# Patient Record
Sex: Female | Born: 1935 | Hispanic: No | Smoking: Current some day smoker
Health system: Southern US, Community
[De-identification: ages and names within clinical notes are randomized; demographics above are authoritative.]

## PROBLEM LIST (undated history)

## (undated) DIAGNOSIS — J449 Chronic obstructive pulmonary disease, unspecified: Secondary | ICD-10-CM

## (undated) HISTORY — PX: HIP FRACTURE SURGERY: SHX118

---

## 2003-11-28 ENCOUNTER — Ambulatory Visit: Payer: Self-pay | Admitting: Rheumatology

## 2004-04-30 ENCOUNTER — Ambulatory Visit: Payer: Self-pay | Admitting: Urology

## 2004-04-30 ENCOUNTER — Ambulatory Visit: Payer: Self-pay | Admitting: Internal Medicine

## 2004-10-26 ENCOUNTER — Ambulatory Visit: Payer: Self-pay | Admitting: Urology

## 2004-11-04 ENCOUNTER — Ambulatory Visit: Payer: Self-pay | Admitting: Urology

## 2005-02-10 ENCOUNTER — Ambulatory Visit: Payer: Self-pay | Admitting: Unknown Physician Specialty

## 2005-06-30 ENCOUNTER — Ambulatory Visit: Payer: Self-pay | Admitting: Internal Medicine

## 2005-08-10 ENCOUNTER — Ambulatory Visit: Payer: Self-pay | Admitting: Gastroenterology

## 2005-09-20 ENCOUNTER — Ambulatory Visit: Payer: Self-pay | Admitting: Gastroenterology

## 2005-09-23 ENCOUNTER — Observation Stay: Payer: Self-pay | Admitting: Gastroenterology

## 2005-09-27 ENCOUNTER — Ambulatory Visit: Payer: Self-pay | Admitting: Gastroenterology

## 2005-11-08 ENCOUNTER — Ambulatory Visit: Payer: Self-pay | Admitting: Surgery

## 2005-11-15 ENCOUNTER — Other Ambulatory Visit: Payer: Self-pay

## 2005-11-15 ENCOUNTER — Inpatient Hospital Stay: Payer: Self-pay | Admitting: Surgery

## 2008-12-19 ENCOUNTER — Ambulatory Visit: Payer: Self-pay | Admitting: Internal Medicine

## 2009-04-30 ENCOUNTER — Inpatient Hospital Stay: Payer: Self-pay | Admitting: General Practice

## 2010-01-22 ENCOUNTER — Ambulatory Visit: Payer: Self-pay | Admitting: Internal Medicine

## 2010-07-06 ENCOUNTER — Ambulatory Visit: Payer: Self-pay | Admitting: Gastroenterology

## 2010-07-08 LAB — PATHOLOGY REPORT

## 2011-01-27 ENCOUNTER — Ambulatory Visit: Payer: Self-pay | Admitting: Internal Medicine

## 2011-06-25 ENCOUNTER — Inpatient Hospital Stay: Payer: Self-pay | Admitting: *Deleted

## 2011-06-25 LAB — CBC
HCT: 45.2 % (ref 35.0–47.0)
MCH: 35.4 pg — ABNORMAL HIGH (ref 26.0–34.0)
MCHC: 34.2 g/dL (ref 32.0–36.0)
Platelet: 197 10*3/uL (ref 150–440)
RBC: 4.37 10*6/uL (ref 3.80–5.20)
RDW: 14 % (ref 11.5–14.5)

## 2011-06-25 LAB — COMPREHENSIVE METABOLIC PANEL
Albumin: 3.1 g/dL — ABNORMAL LOW (ref 3.4–5.0)
Anion Gap: 9 (ref 7–16)
BUN: 18 mg/dL (ref 7–18)
Calcium, Total: 8.7 mg/dL (ref 8.5–10.1)
Co2: 29 mmol/L (ref 21–32)
EGFR (African American): >60
EGFR (Non-African Amer.): >60
Glucose: 95 mg/dL (ref 65–99)
Osmolality: 276 (ref 275–301)
SGPT (ALT): 36 U/L
Sodium: 137 mmol/L (ref 136–145)
Total Protein: 7.2 g/dL (ref 6.4–8.2)

## 2011-06-25 LAB — MAGNESIUM: Magnesium: 2 mg/dL

## 2011-06-25 LAB — TROPONIN I: Troponin-I: <0.02 ng/mL

## 2011-06-25 LAB — CK TOTAL AND CKMB (NOT AT ARMC): CK, Total: 320 U/L — ABNORMAL HIGH (ref 21–215)

## 2011-06-26 LAB — CBC WITH DIFFERENTIAL/PLATELET
Basophil #: 0 10*3/uL (ref 0.0–0.1)
Basophil %: 0.3 %
HCT: 39.1 % (ref 35.0–47.0)
HGB: 13.5 g/dL (ref 12.0–16.0)
Lymphocyte #: 0.5 10*3/uL — ABNORMAL LOW (ref 1.0–3.6)
MCH: 35.8 pg — ABNORMAL HIGH (ref 26.0–34.0)
Monocyte %: 11.2 %
Neutrophil #: 5.2 10*3/uL (ref 1.4–6.5)
Platelet: 182 10*3/uL (ref 150–440)
RDW: 13.7 % (ref 11.5–14.5)
WBC: 6.4 10*3/uL (ref 3.6–11.0)

## 2011-06-26 LAB — BASIC METABOLIC PANEL WITH GFR
Anion Gap: 10
BUN: 20 mg/dL — ABNORMAL HIGH
Calcium, Total: 8.1 mg/dL — ABNORMAL LOW
Chloride: 102 mmol/L
Co2: 27 mmol/L
Creatinine: 0.62 mg/dL
EGFR (African American): >60
EGFR (Non-African Amer.): >60
Glucose: 139 mg/dL — ABNORMAL HIGH
Osmolality: 282
Potassium: 4.1 mmol/L
Sodium: 139 mmol/L

## 2011-06-26 LAB — TROPONIN I: Troponin-I: <0.02 ng/mL

## 2013-02-02 IMAGING — CT CT CHEST W/ CM
1 series · 15 of 33 positions shown, 19 images · IV contrast (agent unspecified)
Comparison: None

REASON FOR EXAM: sob hypoxia pe protocol
COMMENTS:   May transport without cardiac monitor

PROCEDURE:     CT  - CT CHEST WITH CONTRAST  - June 25, 2011  [DATE]
RESULT:     Indications: Shortness of breath
TECHNIQUE: A thin-section spiral CT from the lung apices to the upper
abdomen was acquired on a multi slice scanner following 85ml [id number]-3TF
intravenous contrast. These images were then transferred to the Siemens work
station and were subsequently reviewed utilizing 3-D reconstructions and MIP
images.

[Series 4: soft tissue · axial · 0.58mm/px · z∈[-1050,-764]mm · 15 of 113 slices shown, 19 images]
[im 9/113  mediastinal]
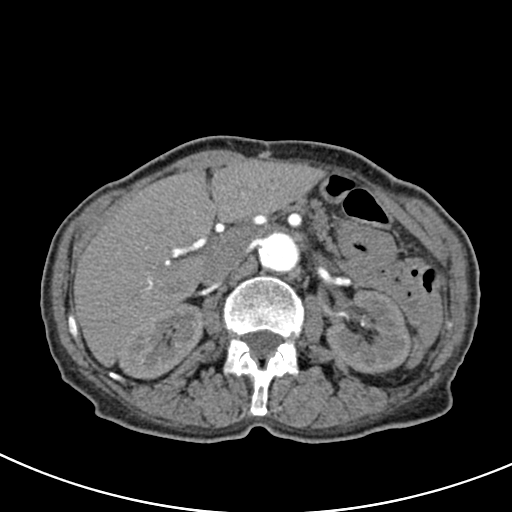
[im 9/113  lung]
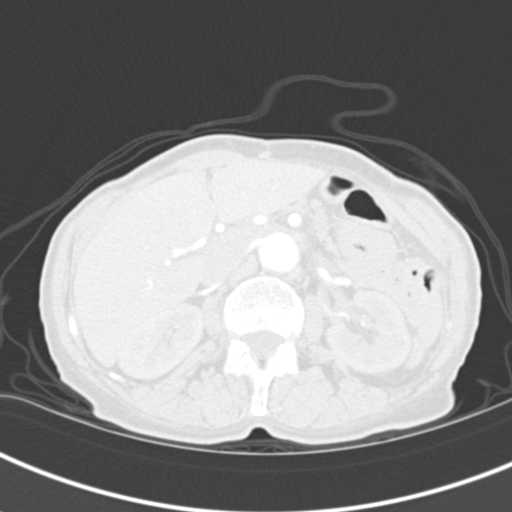
[im 17/113  lung]
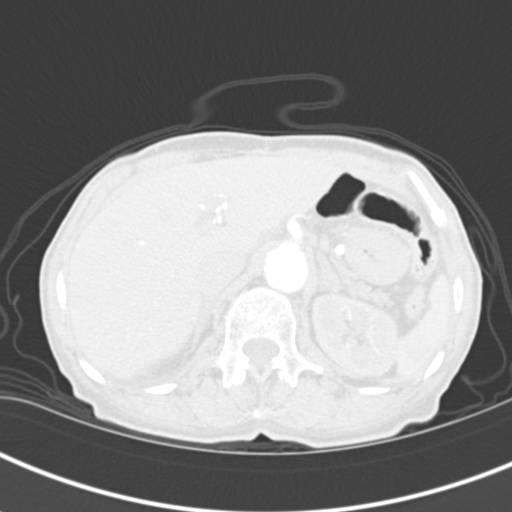
[im 23/113  lung]
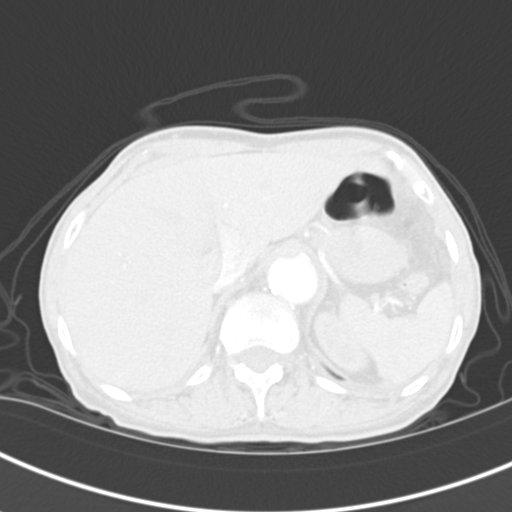
[im 30/113  lung]
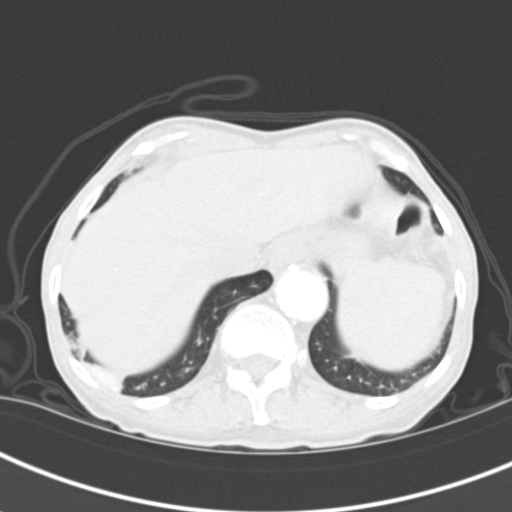
[im 38/113  mediastinal]
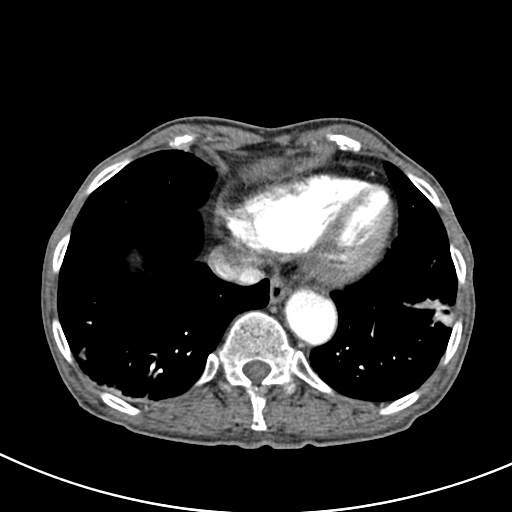
[im 38/113  lung]
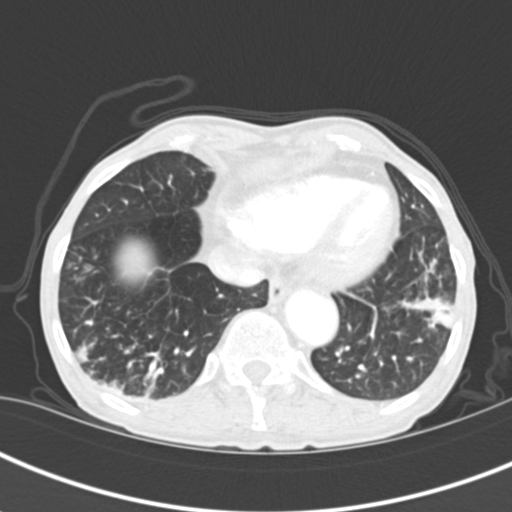
[im 45/113  lung]
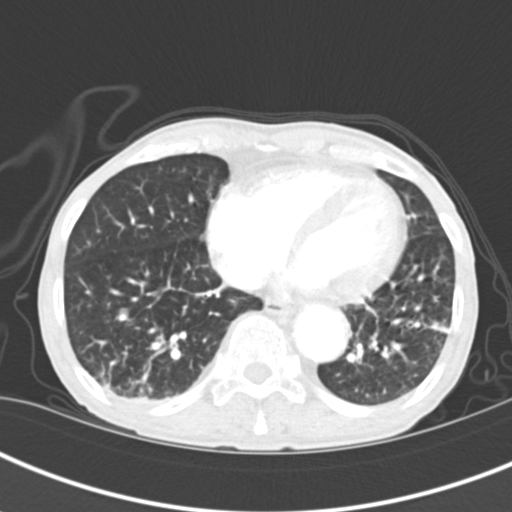
[im 50/113  lung]
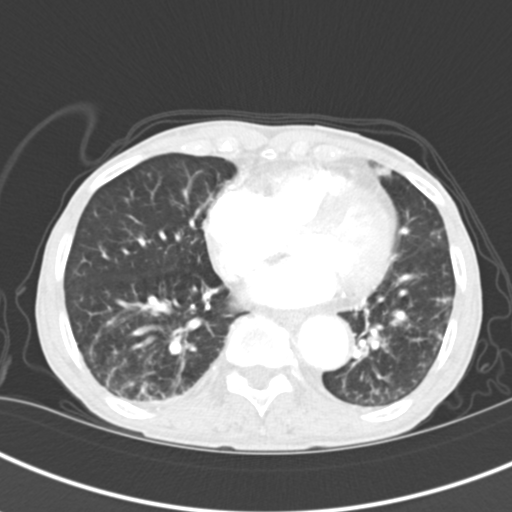
[im 59/113  lung]
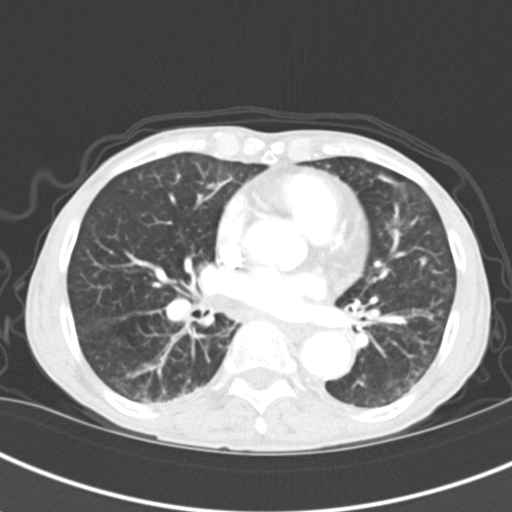
[im 63/113  mediastinal]
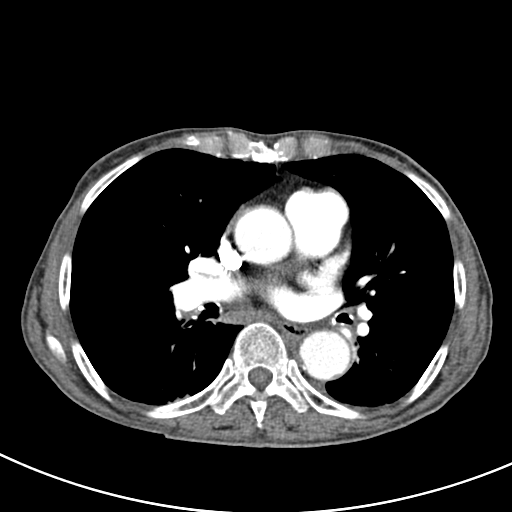
[im 63/113  lung]
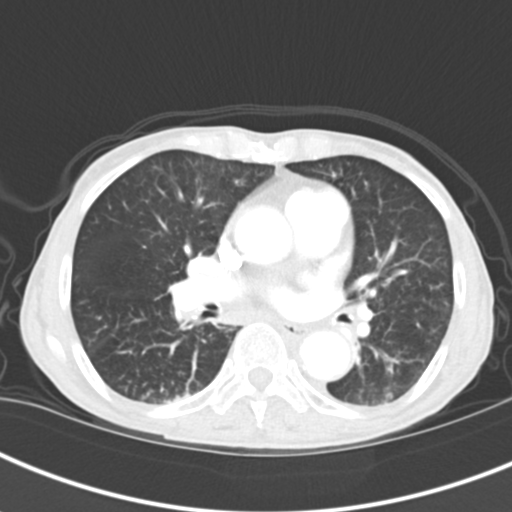
[im 68/113  lung]
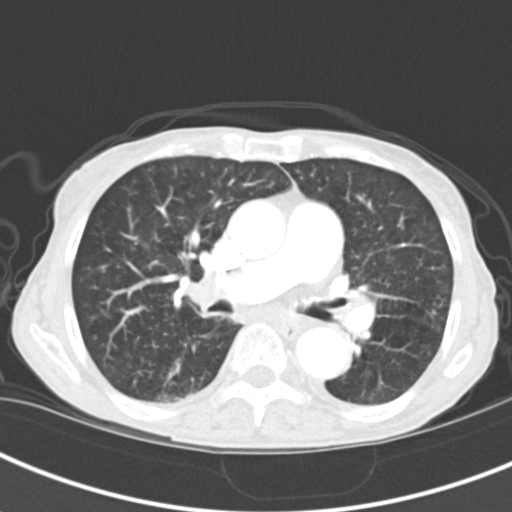
[im 75/113  lung]
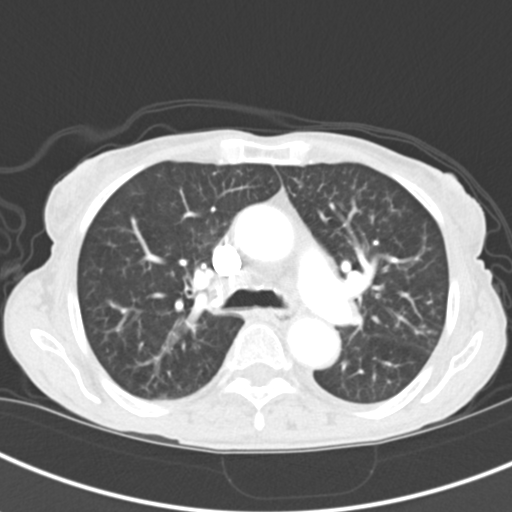
[im 83/113  lung]
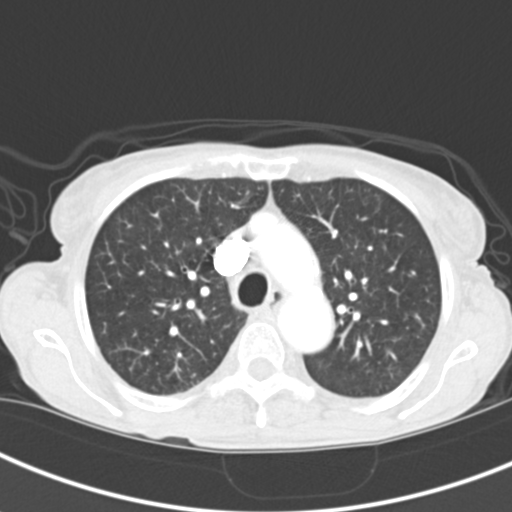
[im 90/113  mediastinal]
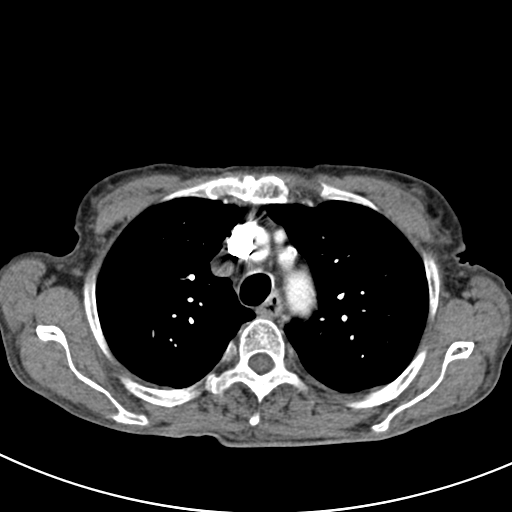
[im 90/113  lung]
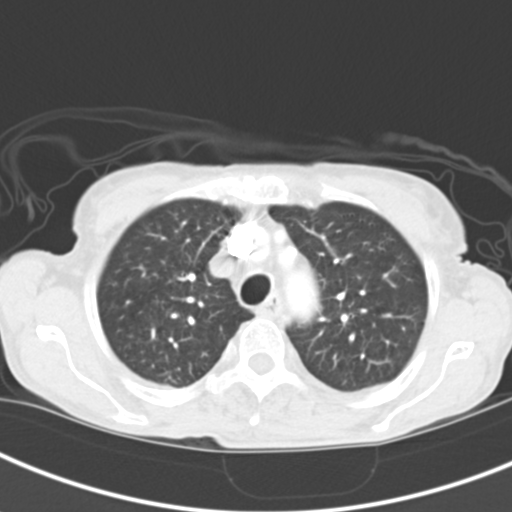
[im 96/113  lung]
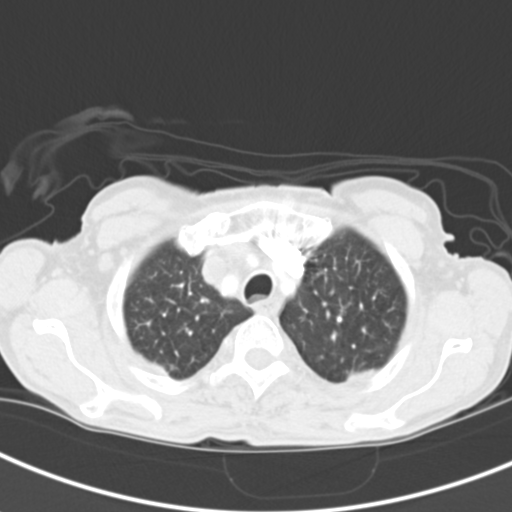
[im 104/113  lung]
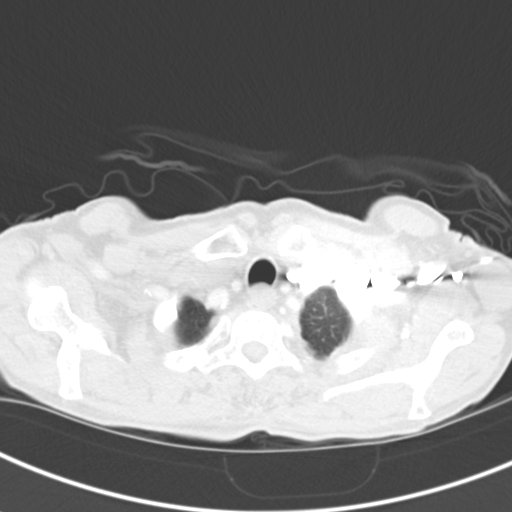

[15 of 33 positions shown; findings below may reference images not displayed]

FINDINGS: There is adequate opacification of the pulmonary arteries. There is no
pulmonary embolus. The main pulmonary artery, right main pulmonary artery,
and left main pulmonary arteries are normal in size. The heart size is
normal. There is no pericardial effusion. There is coronary artery
atherosclerosis involving the LAD.

There is mild reticular-nodular interstitial disease involving the lung
bases which may be secondary to an infectious or inflammatory etiology.
There is bibasilar atelectasis present. There is no pleural effusion or
pneumothorax.

There is no axillary, hilar, or mediastinal adenopathy.

The osseous structures are unremarkable.

The visualized portions of the upper abdomen are unremarkable.
IMPRESSION: 1. No CT evidence of pulmonary embolus.

2. Coronary artery disease most severe in the LAD.

3. Bilateral lower lobe reticular-nodular interstitial disease as can be
seen with an infectious or inflammatory etiology. Correlate with laboratory
values and clinical exam.

[REDACTED]

## 2013-02-19 ENCOUNTER — Ambulatory Visit: Payer: Self-pay | Admitting: Internal Medicine

## 2014-02-03 DIAGNOSIS — J449 Chronic obstructive pulmonary disease, unspecified: Secondary | ICD-10-CM | POA: Diagnosis not present

## 2014-02-05 DIAGNOSIS — J449 Chronic obstructive pulmonary disease, unspecified: Secondary | ICD-10-CM | POA: Diagnosis not present

## 2014-03-06 DIAGNOSIS — J449 Chronic obstructive pulmonary disease, unspecified: Secondary | ICD-10-CM | POA: Diagnosis not present

## 2014-03-08 DIAGNOSIS — J449 Chronic obstructive pulmonary disease, unspecified: Secondary | ICD-10-CM | POA: Diagnosis not present

## 2014-04-04 DIAGNOSIS — J449 Chronic obstructive pulmonary disease, unspecified: Secondary | ICD-10-CM | POA: Diagnosis not present

## 2014-04-06 DIAGNOSIS — J449 Chronic obstructive pulmonary disease, unspecified: Secondary | ICD-10-CM | POA: Diagnosis not present

## 2014-05-05 DIAGNOSIS — J449 Chronic obstructive pulmonary disease, unspecified: Secondary | ICD-10-CM | POA: Diagnosis not present

## 2014-05-05 NOTE — Discharge Summary (Signed)
PATIENT NAME:  Marcia Owens, Karli MR#:  [id number] DATE OF BIRTH:  18-Feb-1930  DATE OF ADMISSION:  06/25/2011 DATE OF DISCHARGE:  06/28/2011  HISTORY OF PRESENT ILLNESS: Marcia Owens is a 81 year old black lady with a long history of chronic obstructive pulmonary disease and continued tobacco abuse who presented to the Emergency Room with increasing shortness of breath, wheezing, and cough. In the Emergency Room, she was noted to have a pulse oximetry of 88% on room air and was therefore admitted.   The patient's past medical history, medications, allergies, and admission physical examination is well outlined in Dr. Urban GibsonGupta's dictated admission note.   LABORATORY, DIAGNOSTIC AND RADIOLOGIC DATA: Admission CBC showed a hemoglobin of 13.5 with a hematocrit of 39.1. Indices were somewhat macrocytic. White count was 6400. Platelet count was 182,000. Admission comprehensive metabolic panel was notable only for a potassium of 3.4 and an albumin of 3.1. Magnesium was 2.0.   Admission arterial blood gases showed a pH of 7.41, a pCO2 of 47, and a pO2 of 46.   Admission electrocardiogram showed a sinus bradycardia with PVCs. There was a right axis consistent with her chronic obstructive pulmonary disease.   Admission chest x-ray showed components of chronic obstructive pulmonary disease. There was evidence of pulmonary fibrosis. No active infiltrate was noted.   Chest CT showed bilateral lower lobe reticular nodular interstitial disease consistent with pulmonary fibrosis or an inflammatory etiology. Coronary artery disease was incidentally noted in the LAD, in the form of calcification.   HOSPITAL COURSE: The patient was admitted to the regular medical floor where she was started on supplemental oxygen. She was also started on an intensive pulmonary toilet of IV steroids, IV antibiotics, and SVNs. She was eventually bridged over to oral agents. She was noted to be hypoxic with ambulation at the time of discharge  and therefore was set up for home oxygen.   DISCHARGE DIAGNOSIS: Acute on chronic respiratory failure secondary to a chronic obstructive pulmonary disease flare.   DISCHARGE MEDICATIONS: The patient was continued on her preadmission inhalers including Advair, Spiriva, and Pro Air. Her other preadmission medications were also left unchanged. She was sent home on a Sterapred double strength six day taper. She was also to continue Levaquin 500 mg daily for an additional four days. She was discharged on home oxygen at 1 liter/minute.   DISCHARGE INSTRUCTIONS/FOLLOWUP: She was strongly encouraged to stop smoking. The patient is to be followed up in the office in 1 to 2 weeks.  ____________________________ Letta PateJohn B. Danne HarborWalker III, MD [id number] D: 07/12/2011 11:24:44 ET T: 07/12/2011 11:34:51 ET JOB#: [id number]  cc: Jonny RuizJohn B. Danne HarborWalker III, MD, <Dictator> Elmo PuttJOHN B WALKER III MD ELECTRONICALLY SIGNED 07/12/2011 15:30

## 2014-05-05 NOTE — H&P (Signed)
PATIENT NAME:  Marcia Owens, Marcia Owens MR#:  [id number] DATE OF BIRTH:  1930/10/15  DATE OF ADMISSION:  06/25/2011  PRIMARY CARE PHYSICIAN: Dr. Yates DecampJohn Walker    CHIEF COMPLAINT: Headache and worsening shortness of breath.   HISTORY OF PRESENT ILLNESS: The patient is a 81 year old female who has history of COPD, not on home oxygen. She is still smoking. She also has history of hypertension and osteoarthritis. She has Fiorinal dependence, chronic headaches. She presented today because she said she had increasing shortness of breath going on for about 3 to 4 days, probably this week. She is complaining of chest tightness, wheezing, cough with clear expectoration. She has some dyspnea on exertion also. She went to see her primary care physician yesterday. She was referred to Pulmonology. She said she continued to be short of breath so she came here. She is using her Advair, Spiriva, and albuterol p.r.n. at home but she says she does not get much relief. In the Emergency Room she was noted to be hypoxic with sats 88% on room air. She got 125 mg of IV Solu-Medrol in the Emergency Room along with DuoNeb. She says she feels better with that. Also, CT of the chest has been ordered by the ER physician which is still pending at this time. Also she is complaining she is having headache on the top of her head. She has chronic headaches. She takes Fiorinal for headaches on a regular basis as needed. She denies any nausea, vomiting, or abdominal pain. She had an ABG done in the Emergency Room which showed pH of 7.41, pCO2 47, pO2 of 46 on room air. She is being admitted for COPD exacerbation, hypoxia.   REVIEW OF SYSTEMS: She is complaining of poor appetite. No fever. No acute change in vision. Complaining of headache which is chronic. She is complaining of some sinus drainage. Also complaining of cough, dyspnea. No painful respiration but complaining of wheezing and COPD. No hemoptysis. She has some mild dyspnea on exertion. No  palpitations or syncope. No nausea, vomiting, abdominal pain, or GI bleed. No dysuria. No frequency. No thyroid problems. No anemia. No rash. She has arthritis. No focal numbness or weakness. No anxiety.   PAST MEDICAL HISTORY:  1. Hypertension. 2. Chronic obstructive pulmonary disease.  3. Osteoarthritis. 4. Carpal tunnel syndrome. 5. Cervical spinal stenosis.  6. Fiorinal dependence. 7. Post bulbar esophageal stricture. 8. Tobacco abuse.  9. Vitamin D deficiency.   PAST SURGICAL HISTORY:  1. Thyroidectomy for goiter in the past. 2. Right hip fracture repair.  3. She said she had part of her stomach taken out also before.  4. She had a duodenal ulcer repair in 2007.   HOME MEDICATIONS:  1. Advair Diskus 500/50 b.i.d.  2. Fiorinal, aspirin, butalbital, caffeine 1 tablet q.4 hours as needed.  3. Hydrochlorothiazide/triamterene 1 tablet daily.  4. Lyrica 75 mg at bedtime.  5. ProAir p.r.n. 2 puffs every four hours as needed. 6. Spiriva HandiHaler daily.   ALLERGIES: Codeine, flu vaccine, Pneumovax, and Tylenol.   SOCIAL HISTORY: She lives with her son. She smokes she says about 2 packs a week. She said she has not smoked for about a week now. No alcohol or drug use.   FAMILY HISTORY: Her son has colon cancer. Mother had heart disease in the past and dad had stroke.   PHYSICAL EXAMINATION:   VITAL SIGNS: Today when she came in, temperature 97, heart rate 55, respiratory rate 26, blood pressure 159/74, saturating 88% on room air.  GENERAL: This is an elderly African American female. She is comfortably lying in bed on 2 liters of oxygen not in acute distress.   HEENT: Bilateral pupils are equal and reactive to light. Extraocular muscles are intact. No scleral icterus. No conjunctivitis. Oral mucosa is moist. No pallor.   NECK: No thyroid tenderness, enlargement, or nodules. Neck is supple. No masses, nontender. No adenopathy. No JVD. No carotid bruit.   CHEST: She has some  coarse breath sounds. She has expiratory wheezing but she is not using accessory muscles. Normal respiratory effort.   HEART: Heart sounds are regular. Good peripheral pulses. No lower extremity edema.   ABDOMEN: Soft, nontender. Normal bowel sounds. No hepatosplenomegaly. No bruit. No masses.   RECTAL: Deferred.   NEUROLOGIC: She is awake, alert, oriented to time, place, and person. Good insight. Cranial nerves are intact. Moving all extremities against gravity.   EXTREMITIES: No cyanosis. No clubbing.   SKIN: No rash. No lesions.   LABORATORY, DIAGNOSTIC, AND RADIOLOGICAL DATA: White count 8.7, hemoglobin 15.5, platelet count 197,000. BMP sodium 137, potassium 3.4, BUN 18, creatinine 0.7. LFTs are normal. Mag is 2. CK 320. Troponin 0.02. ABG pH of 7.41, pCO2 47, pO2 46 on room air. She had a chest x-ray which was reported as interstitial infiltrate, a component of which may represent pulmonary fibrosis. Some volume loss in the left lung base. COPD. Her EKG shows that she has sinus bradycardia. She has some PVCs. She has Q waves in anteroseptal leads, unchanged from prior EKGs.   IMPRESSION:  1. Chronic obstructive pulmonary disease exacerbation.  2. Acute bronchitis.  3. Hypoxia. 4. Chronic headache. 5. Tobacco abuse.  6. Hypertension.  7. Mild hypokalemia.   PLAN: This is a 81 year old female who has a history of COPD. She is still smoking. About two packs last her a week. She is not on home oxygen. She presented with increasing shortness of breath for about a week, some cough with clear phlegm. She is admitted as COPD exacerbation. She is found to be hypoxic, 88% on room air. Will start her on IV Solu-Medrol. Will also give her Levaquin. Continue her Advair and Spiriva and give her DuoNebs. Her chest x-ray shows some fibrosis and interstitial infiltrate. Will also order a sputum culture. A CT of the chest has been ordered by the ER physician to rule out PE. Will follow that. Will give  her some IV hydration because she is getting a contrast study and hold her triamterene/hydrochlorothiazide because she is getting IV fluids and a contrast study. I advised her smoking cessation. I offered her nicotine patches but she said she doesn't need nicotine patches at this time. Will continue Lyrica at bedtime. Will give her potassium chloride 20 mEq p.o. once for her hypokalemia. Also, she needs to be evaluated for home oxygen at the time of discharge. She was referred to Pulmonology as outpatient. She can see Dr. Meredeth Ide as outpatient. The patient will be transferred to Dr. Tilman Neat service.  TIME SPENT WITH ADMISSION AND COORDINATION OF CARE: 50 minutes.    ____________________________ Fredia Sorrow, MD ag:drc D: 06/25/2011 13:20:07 ET T: 06/25/2011 14:01:43 ET JOB#: [id number]  cc: Fredia Sorrow, MD, <Dictator> John B. Danne Harbor, MD Fredia Sorrow MD ELECTRONICALLY SIGNED 06/30/2011 11:29

## 2014-05-07 DIAGNOSIS — J449 Chronic obstructive pulmonary disease, unspecified: Secondary | ICD-10-CM | POA: Diagnosis not present

## 2014-05-29 DIAGNOSIS — I10 Essential (primary) hypertension: Secondary | ICD-10-CM | POA: Diagnosis not present

## 2014-05-29 DIAGNOSIS — R51 Headache: Secondary | ICD-10-CM | POA: Diagnosis not present

## 2014-05-29 DIAGNOSIS — J449 Chronic obstructive pulmonary disease, unspecified: Secondary | ICD-10-CM | POA: Diagnosis not present

## 2014-05-29 DIAGNOSIS — E079 Disorder of thyroid, unspecified: Secondary | ICD-10-CM | POA: Diagnosis not present

## 2014-06-04 DIAGNOSIS — J449 Chronic obstructive pulmonary disease, unspecified: Secondary | ICD-10-CM | POA: Diagnosis not present

## 2014-06-06 DIAGNOSIS — J449 Chronic obstructive pulmonary disease, unspecified: Secondary | ICD-10-CM | POA: Diagnosis not present

## 2014-07-05 DIAGNOSIS — J449 Chronic obstructive pulmonary disease, unspecified: Secondary | ICD-10-CM | POA: Diagnosis not present

## 2014-07-07 DIAGNOSIS — J449 Chronic obstructive pulmonary disease, unspecified: Secondary | ICD-10-CM | POA: Diagnosis not present

## 2014-08-04 DIAGNOSIS — J449 Chronic obstructive pulmonary disease, unspecified: Secondary | ICD-10-CM | POA: Diagnosis not present

## 2014-08-06 DIAGNOSIS — J449 Chronic obstructive pulmonary disease, unspecified: Secondary | ICD-10-CM | POA: Diagnosis not present

## 2014-09-04 DIAGNOSIS — J449 Chronic obstructive pulmonary disease, unspecified: Secondary | ICD-10-CM | POA: Diagnosis not present

## 2014-09-06 DIAGNOSIS — J449 Chronic obstructive pulmonary disease, unspecified: Secondary | ICD-10-CM | POA: Diagnosis not present

## 2014-10-05 DIAGNOSIS — J449 Chronic obstructive pulmonary disease, unspecified: Secondary | ICD-10-CM | POA: Diagnosis not present

## 2014-10-07 DIAGNOSIS — J449 Chronic obstructive pulmonary disease, unspecified: Secondary | ICD-10-CM | POA: Diagnosis not present

## 2014-11-04 DIAGNOSIS — J449 Chronic obstructive pulmonary disease, unspecified: Secondary | ICD-10-CM | POA: Diagnosis not present

## 2014-11-06 DIAGNOSIS — J449 Chronic obstructive pulmonary disease, unspecified: Secondary | ICD-10-CM | POA: Diagnosis not present

## 2014-11-29 DIAGNOSIS — I10 Essential (primary) hypertension: Secondary | ICD-10-CM | POA: Diagnosis not present

## 2014-11-29 DIAGNOSIS — J449 Chronic obstructive pulmonary disease, unspecified: Secondary | ICD-10-CM | POA: Diagnosis not present

## 2014-11-29 DIAGNOSIS — E559 Vitamin D deficiency, unspecified: Secondary | ICD-10-CM | POA: Diagnosis not present

## 2014-11-29 DIAGNOSIS — E079 Disorder of thyroid, unspecified: Secondary | ICD-10-CM | POA: Diagnosis not present

## 2014-12-05 DIAGNOSIS — J449 Chronic obstructive pulmonary disease, unspecified: Secondary | ICD-10-CM | POA: Diagnosis not present

## 2014-12-07 DIAGNOSIS — J449 Chronic obstructive pulmonary disease, unspecified: Secondary | ICD-10-CM | POA: Diagnosis not present

## 2015-01-04 DIAGNOSIS — J449 Chronic obstructive pulmonary disease, unspecified: Secondary | ICD-10-CM | POA: Diagnosis not present

## 2015-01-06 DIAGNOSIS — J449 Chronic obstructive pulmonary disease, unspecified: Secondary | ICD-10-CM | POA: Diagnosis not present

## 2015-01-11 ENCOUNTER — Emergency Department
Admission: EM | Admit: 2015-01-11 | Discharge: 2015-01-11 | Disposition: A | Discharge status: Home / Self Care | Payer: No Typology Code available for payment source | Attending: Emergency Medicine | Admitting: Emergency Medicine

## 2015-01-11 ENCOUNTER — Emergency Department: Payer: No Typology Code available for payment source

## 2015-01-11 ENCOUNTER — Encounter: Payer: Self-pay | Admitting: Emergency Medicine

## 2015-01-11 DIAGNOSIS — I10 Essential (primary) hypertension: Secondary | ICD-10-CM | POA: Diagnosis not present

## 2015-01-11 DIAGNOSIS — Y998 Other external cause status: Secondary | ICD-10-CM | POA: Insufficient documentation

## 2015-01-11 DIAGNOSIS — Y9389 Activity, other specified: Secondary | ICD-10-CM | POA: Diagnosis not present

## 2015-01-11 DIAGNOSIS — S5011XA Contusion of right forearm, initial encounter: Secondary | ICD-10-CM | POA: Diagnosis not present

## 2015-01-11 DIAGNOSIS — R51 Headache: Secondary | ICD-10-CM | POA: Diagnosis not present

## 2015-01-11 DIAGNOSIS — S59911A Unspecified injury of right forearm, initial encounter: Secondary | ICD-10-CM | POA: Diagnosis present

## 2015-01-11 DIAGNOSIS — F17210 Nicotine dependence, cigarettes, uncomplicated: Secondary | ICD-10-CM | POA: Insufficient documentation

## 2015-01-11 DIAGNOSIS — Y92410 Unspecified street and highway as the place of occurrence of the external cause: Secondary | ICD-10-CM | POA: Diagnosis not present

## 2015-01-11 DIAGNOSIS — S0990XA Unspecified injury of head, initial encounter: Secondary | ICD-10-CM | POA: Insufficient documentation

## 2015-01-11 DIAGNOSIS — R6889 Other general symptoms and signs: Secondary | ICD-10-CM | POA: Diagnosis not present

## 2015-01-11 HISTORY — DX: Chronic obstructive pulmonary disease, unspecified: J44.9

## 2015-01-11 MED ORDER — TRIAMTERENE-HCTZ 37.5-25 MG PO CAPS: 1.0000 | ORAL_CAPSULE | Freq: Every day | ORAL | Status: AC

## 2015-01-11 MED ORDER — ACETAMINOPHEN 325 MG PO TABS
650.0000 mg | ORAL_TABLET | Freq: Once | ORAL | Status: AC
  Administered 2015-01-11: 650 mg via ORAL
  Filled 2015-01-11: qty 2

## 2015-01-11 MED ORDER — TRIAMTERENE-HCTZ 37.5-25 MG PO TABS
1.0000 | ORAL_TABLET | Freq: Every day | ORAL | Status: DC
  Administered 2015-01-11: 1 via ORAL
  Filled 2015-01-11: qty 1
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
## 2015-01-11 NOTE — ED Notes (Signed)
Pt continued to have headache, CT scan reviewed, patient kept for main weight due to age and length of flex wait at this time.

## 2015-01-11 NOTE — ED Notes (Signed)
Pharmacy notified to send medication to ED as soon as possible.
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
## 2015-02-04 DIAGNOSIS — J449 Chronic obstructive pulmonary disease, unspecified: Secondary | ICD-10-CM | POA: Diagnosis not present

## 2015-02-06 DIAGNOSIS — J449 Chronic obstructive pulmonary disease, unspecified: Secondary | ICD-10-CM | POA: Diagnosis not present

## 2015-03-07 DIAGNOSIS — J449 Chronic obstructive pulmonary disease, unspecified: Secondary | ICD-10-CM | POA: Diagnosis not present

## 2015-03-08 ENCOUNTER — Emergency Department
Admission: EM | Admit: 2015-03-08 | Discharge: 2015-03-08 | Disposition: A | Discharge status: Home / Self Care | Payer: Commercial Managed Care - HMO | Attending: Emergency Medicine | Admitting: Emergency Medicine

## 2015-03-08 ENCOUNTER — Emergency Department: Payer: Commercial Managed Care - HMO

## 2015-03-08 DIAGNOSIS — R05 Cough: Secondary | ICD-10-CM | POA: Diagnosis not present

## 2015-03-08 DIAGNOSIS — R0602 Shortness of breath: Secondary | ICD-10-CM | POA: Diagnosis not present

## 2015-03-08 DIAGNOSIS — Z9981 Dependence on supplemental oxygen: Secondary | ICD-10-CM | POA: Diagnosis not present

## 2015-03-08 DIAGNOSIS — R059 Cough, unspecified: Secondary | ICD-10-CM

## 2015-03-08 DIAGNOSIS — Z743 Need for continuous supervision: Secondary | ICD-10-CM | POA: Diagnosis not present

## 2015-03-08 DIAGNOSIS — Z79899 Other long term (current) drug therapy: Secondary | ICD-10-CM | POA: Insufficient documentation

## 2015-03-08 DIAGNOSIS — J449 Chronic obstructive pulmonary disease, unspecified: Secondary | ICD-10-CM | POA: Diagnosis not present

## 2015-03-08 DIAGNOSIS — F17210 Nicotine dependence, cigarettes, uncomplicated: Secondary | ICD-10-CM | POA: Insufficient documentation

## 2015-03-08 DIAGNOSIS — J441 Chronic obstructive pulmonary disease with (acute) exacerbation: Secondary | ICD-10-CM | POA: Insufficient documentation

## 2015-03-08 LAB — CBC WITH DIFFERENTIAL/PLATELET
Basophils Absolute: 0 10*3/uL (ref 0–0.1)
Basophils Relative: 1 %
EOS ABS: 0 10*3/uL (ref 0–0.7)
Eosinophils Relative: 0 %
HEMATOCRIT: 49.4 % — AB (ref 35.0–47.0)
HEMOGLOBIN: 17.1 g/dL — AB (ref 12.0–16.0)
LYMPHS ABS: 0.6 10*3/uL — AB (ref 1.0–3.6)
Lymphocytes Relative: 9 %
MCH: 35.3 pg — AB (ref 26.0–34.0)
MCHC: 34.7 g/dL (ref 32.0–36.0)
MCV: 101.8 fL — ABNORMAL HIGH (ref 80.0–100.0)
MONOS PCT: 14 %
Monocytes Absolute: 0.9 10*3/uL (ref 0.2–0.9)
NEUTROS ABS: 5.3 10*3/uL (ref 1.4–6.5)
NEUTROS PCT: 76 %
Platelets: 154 10*3/uL (ref 150–440)
RBC: 4.85 MIL/uL (ref 3.80–5.20)
RDW: 13.9 % (ref 11.5–14.5)
WBC: 6.8 10*3/uL (ref 3.6–11.0)

## 2015-03-08 LAB — TROPONIN I

## 2015-03-08 LAB — BASIC METABOLIC PANEL
ANION GAP: 10 (ref 5–15)
BUN: 14 mg/dL (ref 6–20)
CALCIUM: 8.7 mg/dL — AB (ref 8.9–10.3)
CHLORIDE: 96 mmol/L — AB (ref 101–111)
CO2: 31 mmol/L (ref 22–32)
CREATININE: 1.06 mg/dL — AB (ref 0.44–1.00)
GFR calc Af Amer: 55 mL/min — ABNORMAL LOW (ref >60)
GFR calc non Af Amer: 48 mL/min — ABNORMAL LOW (ref >60)
GLUCOSE: 111 mg/dL — AB (ref 65–99)
POTASSIUM: 3.3 mmol/L — AB (ref 3.5–5.1)
SODIUM: 137 mmol/L (ref 135–145)

## 2015-03-08 LAB — BRAIN NATRIURETIC PEPTIDE: B Natriuretic Peptide: 56 pg/mL (ref 0.0–100.0)

## 2015-03-08 MED ORDER — BENZONATATE 100 MG PO CAPS
200.0000 mg | ORAL_CAPSULE | Freq: Once | ORAL | Status: AC
  Administered 2015-03-08: 200 mg via ORAL
  Filled 2015-03-08: qty 2

## 2015-03-08 MED ORDER — DEXTROSE 5 % IV SOLN
1.0000 g | Freq: Once | INTRAVENOUS | Status: AC
  Administered 2015-03-08: 1 g via INTRAVENOUS
  Filled 2015-03-08: qty 10

## 2015-03-08 MED ORDER — METHYLPREDNISOLONE SODIUM SUCC 125 MG IJ SOLR
125.0000 mg | Freq: Once | INTRAMUSCULAR | Status: AC
  Administered 2015-03-08: 125 mg via INTRAVENOUS
  Filled 2015-03-08: qty 2

## 2015-03-08 MED ORDER — AZITHROMYCIN 250 MG PO TABS: ORAL_TABLET | ORAL | Status: AC

## 2015-03-08 MED ORDER — IPRATROPIUM BROMIDE 0.02 % IN SOLN
0.5000 mg | Freq: Once | RESPIRATORY_TRACT | Status: AC
  Administered 2015-03-08: 0.5 mg via RESPIRATORY_TRACT
  Filled 2015-03-08: qty 2.5

## 2015-03-08 MED ORDER — ALBUTEROL SULFATE (2.5 MG/3ML) 0.083% IN NEBU
5.0000 mg | INHALATION_SOLUTION | Freq: Once | RESPIRATORY_TRACT | Status: AC
  Administered 2015-03-08: 5 mg via RESPIRATORY_TRACT
  Filled 2015-03-08: qty 6

## 2015-03-08 MED ORDER — BENZONATATE 100 MG PO CAPS: 100.0000 mg | ORAL_CAPSULE | Freq: Three times a day (TID) | ORAL | Status: AC | PRN

## 2015-03-08 MED ORDER — DEXTROSE 5 % IV SOLN
500.0000 mg | Freq: Once | INTRAVENOUS | Status: AC
  Administered 2015-03-08: 500 mg via INTRAVENOUS
  Filled 2015-03-08: qty 500

## 2015-03-08 MED ORDER — PREDNISONE 10 MG PO TABS: ORAL_TABLET | ORAL | Status: AC

## 2015-03-08 NOTE — ED Notes (Signed)
Patients walking SpO2 sat with 2 liter of O2 via [place] 91-92% with desats to 87% on 2 Liters

## 2015-03-08 NOTE — ED Provider Notes (Addendum)
[facility] Emergency Department Provider Note  ____________________________________________   I have reviewed the triage vital signs and the nursing notes.   HISTORY  Chief Complaint Shortness of Breath    HPI Marcia Owens is a 82 y.o. female with a history of COPD ongoing smoking on 1 L home oxygen who recently had to go to 2 L yesterday because she has had a cough and wheeze. Patient denies fever. She denies chest pain. She denies leg swelling. She has a productive cough. She states that she was feeling very short of breath when EMS arrived. According to EMS, her oxygen saturation was 86% on 2 L. They gave her DuoNeb and her sats improved but patient still feels winded. She states that she is feeling better however then she was went EMS was called. She denies any leg swelling or history of PE or DVT. She denies any chest pain. At this time she states she still feels short of breath over her baseline and has a cough and wheeze. He is compliant she states with her home nebulizers. Patient states that the only thing that makes her breathing better as her nebs but that wears off too quickly. She also feels some relief with oxygen although again was hypoxic when EMS arrived. Thing that makes it worse is coughing and moving around.  Past Medical History  Diagnosis Date  . COPD (chronic obstructive pulmonary disease) (HCC)     There are no active problems to display for this patient.   Past Surgical History  Procedure Laterality Date  . Hip fracture surgery      Current Outpatient Rx  Name  Route  Sig  Dispense  Refill  . triamterene-hydrochlorothiazide (DYAZIDE) 37.5-25 MG capsule   Oral   Take 1 each (1 capsule total) by mouth daily.   30 capsule   0     Allergies Review of patient's allergies indicates no known allergies.  No family history on file.  Social History Social History  Substance Use Topics  . Smoking status: Current Some Day  Smoker -- 0.10 packs/day    Types: Cigarettes  . Smokeless tobacco: None  . Alcohol Use: No    Review of Systems Constitutional: No fever/chills Eyes: No visual changes. ENT: No sore throat. No stiff neck no neck pain Cardiovascular: Denies chest pain. Respiratory: Positive shortness of breath. Gastrointestinal:   no vomiting.  No diarrhea.  No constipation. Genitourinary: Negative for dysuria. Musculoskeletal: Negative lower extremity swelling Skin: Negative for rash. Neurological: Negative for headaches, focal weakness or numbness. 10-point ROS otherwise negative.  ____________________________________________   PHYSICAL EXAM:  VITAL SIGNS: ED Triage Vitals  Enc Vitals Group     BP 03/08/15 0615 154/82 mmHg     Pulse Rate 03/08/15 0615 107     Resp 03/08/15 0615 20     Temp 03/08/15 0615 98.8 F (37.1 C)     Temp Source 03/08/15 0615 Oral     SpO2 03/08/15 0611 86 %     Weight 03/08/15 0615 119 lb (53.978 kg)     Height 03/08/15 0615  (1.6 m)     Head Cir --      Peak Flow --      Pain Score 03/08/15 0617 10     Pain Loc --      Pain Edu? --      Excl. in GC? --     Constitutional: Alert and oriented. Patient is nontoxic but moving around in the  bed makes her winded and has to stop talking at that point. Otherwise can speak in 45 word sentences with no significant difficulty Eyes: Conjunctivae are normal. PERRL. EOMI. Head: Atraumatic. Nose: No congestion/rhinnorhea. Mouth/Throat: Mucous membranes are moist.  Oropharynx non-erythematous. Neck: No stridor.   Nontender with no meningismus Cardiovascular: Normal rate, regular rhythm. Grossly normal heart sounds.  Good peripheral circulation. Respiratory: Patient with mild diffuse wheezes no rales or rhonchi. Somewhat increased work of breathing especially when she moves Abdominal: Soft and nontender. No distention. No guarding no rebound Back:  There is no focal tenderness or step off there is no midline  tenderness there are no lesions noted. there is no CVA tenderness Musculoskeletal: No lower extremity tenderness. No joint effusions, no DVT signs strong distal pulses no edema Neurologic:  Normal speech and language. No gross focal neurologic deficits are appreciated.  Skin:  Skin is warm, dry and intact. No rash noted. Psychiatric: Mood and affect are normal. Speech and behavior are normal.  ____________________________________________   LABS (all labs ordered are listed, but only abnormal results are displayed)  Labs Reviewed  CULTURE, BLOOD (ROUTINE X 2)  CULTURE, BLOOD (ROUTINE X 2)  CBC WITH DIFFERENTIAL/PLATELET  TROPONIN I  BASIC METABOLIC PANEL   ____________________________________________  EKG  I personally interpreted any EKGs ordered by me or triage Right bundle-branch block rate 100 bpm no acute ST elevation, T-wave changes consistent with bundle-branch block ____________________________________________  RADIOLOGY  I reviewed any imaging ordered by me or triage that were performed during my shift ____________________________________________   PROCEDURES  Procedure(s) performed: None  Critical Care performed: None  ____________________________________________   INITIAL IMPRESSION / ASSESSMENT AND PLAN / ED COURSE  Pertinent labs & imaging results that were available during my care of the patient were reviewed by me and considered in my medical decision making (see chart for details).  Patient with COPD on home oxygen presents with increased shortness of breath and wheeze and productive cough. We'll get basic blood work chest x-ray blood cultures. EKG is reassuring but we will send a troponin as a precaution given her age. Patient likely will need admission. We'll give her Solu-Medrol and albuterol. Given the productive nature of the cough we'll likely treat her for community-acquired infection as well. ____________________________________________   FINAL  CLINICAL IMPRESSION(S) / ED DIAGNOSES  Final diagnoses:  Cough      This chart was dictated using voice recognition software.  Despite best efforts to proofread,  errors can occur which can change meaning.     Jeanmarie Plant, MD 03/08/15 [id number]  Jeanmarie Plant, MD 03/08/15 [phone]

## 2015-03-08 NOTE — Discharge Instructions (Signed)
You were evaluated for wheezing and coughing are treated for COPD exacerbation with possible early pneumonia. I return to the emergency department for any worsening condition including trouble breathing, shortness of breath, fever, chest pain, dizziness or passing out, or any other symptoms concerning to you.   Chronic Obstructive Pulmonary Disease Exacerbation Chronic obstructive pulmonary disease (COPD) is a common lung condition in which airflow from the lungs is limited. COPD is a general term that can be used to describe many different lung problems that limit airflow, including chronic bronchitis and emphysema. COPD exacerbations are episodes when breathing symptoms become much worse and require extra treatment. Without treatment, COPD exacerbations can be life threatening, and frequent COPD exacerbations can cause further damage to your lungs. CAUSES  Respiratory infections.  Exposure to smoke.  Exposure to air pollution, chemical fumes, or dust. Sometimes there is no apparent cause or trigger. RISK FACTORS  Smoking cigarettes.  Older age.  Frequent prior COPD exacerbations. SIGNS AND SYMPTOMS  Increased coughing.  Increased thick spit (sputum) production.  Increased wheezing.  Increased shortness of breath.  Rapid breathing.  Chest tightness. DIAGNOSIS Your medical history, a physical exam, and tests will help your health care provider make a diagnosis. Tests may include:  A chest X-ray.  Basic lab tests.  Sputum testing.  An arterial blood gas test. TREATMENT Depending on the severity of your COPD exacerbation, you may need to be admitted to a hospital for treatment. Some of the treatments commonly used to treat COPD exacerbations are:   Antibiotic medicines.  Bronchodilators. These are drugs that expand the air passages. They may be given with an inhaler or nebulizer. Spacer devices may be needed to help improve drug delivery.  Corticosteroid  medicines.  Supplemental oxygen therapy.  Airway clearing techniques, such as noninvasive ventilation (NIV) and positive expiratory pressure (PEP). These provide respiratory support through a mask or other noninvasive device. HOME CARE INSTRUCTIONS  Do not smoke. Quitting smoking is very important to prevent COPD from getting worse and exacerbations from happening as often.  Avoid exposure to all substances that irritate the airway, especially to tobacco smoke.  If you were prescribed an antibiotic medicine, finish it all even if you start to feel better.  Take all medicines as directed by your health care provider.It is important to use correct technique with inhaled medicines.  Drink enough fluids to keep your urine clear or pale yellow (unless you have a medical condition that requires fluid restriction).  Use a cool mist vaporizer. This makes it easier to clear your chest when you cough.  If you have a home nebulizer and oxygen, continue to use them as directed.  Maintain all necessary vaccinations to prevent infections.  Exercise regularly.  Eat a healthy diet.  Keep all follow-up appointments as directed by your health care provider. SEEK IMMEDIATE MEDICAL CARE IF:  You have worsening shortness of breath.  You have trouble talking.  You have severe chest pain.  You have blood in your sputum.  You have a fever.  You have weakness, vomit repeatedly, or faint.  You feel confused.  You continue to get worse. MAKE SURE YOU:  Understand these instructions.  Will watch your condition.  Will get help right away if you are not doing well or get worse.   This information is not intended to replace advice given to you by your health care provider. Make sure you discuss any questions you have with your health care provider.   Document Released: 10/25/2006  Document Revised: 01/18/2014 Document Reviewed: 09/01/2012 Elsevier Interactive Patient Education [employer].

## 2015-03-08 NOTE — ED Provider Notes (Signed)
[facility]  I accepted care from Dr. Alphonzo Lemmings ____________________________________________    LABS (pertinent positives/negatives)  White blood cell count 6.8, hemoglobin 17.1 and platelet count 154 Troponin less than 0.03 Potassium 3.3 otherwise middle panel without significant abnormalities BNP 56  ____________________________________________    RADIOLOGY All xrays were viewed by me. Imaging interpreted by radiologist.  Chest 1 view:   IMPRESSION: Vascular congestion noted. Mild peribronchial thickening seen. ____________________________________________   PROCEDURES  Procedure(s) performed: None  Critical Care performed: None  ____________________________________________   INITIAL IMPRESSION / ASSESSMENT AND PLAN / ED COURSE   Pertinent labs & imaging results that were available during my care of the patient were reviewed by me and considered in my medical decision making (see chart for details).  I reviewed the patient's laboratory studies with her and her family. Her labs are reassuring. Clinically she is improved after breathing treatment. Still some rhonchi, and only end expiratory wheeze but breathing comfortably at rest and her oxygen saturation is now 97% on her home 2 L.  Discussion with Dr. Alphonzo Lemmings initially considered possible admission, however as patient looks better, feels better, has reassuring laboratory studies, and is now back to baseline on her home 2 L, I think it's reasonable to discharge her home and she is comfortable and happy about this.   CONSULTATIONS:  none  Patient / Family / Caregiver informed of clinical course, medical decision-making process, and agree with plan.   I discussed return precautions, follow-up instructions, and discharged instructions with patient and/or family.     ____________________________________________   FINAL CLINICAL IMPRESSION(S) / ED DIAGNOSES  Final diagnoses:  Cough   Chronic obstructive pulmonary disease with acute exacerbation (HCC)        Governor Rooks, MD 03/08/15 1030

## 2015-03-08 NOTE — ED Notes (Signed)
Pt arrives to ED via [facility] d/t c/o shortness of breath x2 days. Pt reports productive cough (white/clear sputum) with epigastric discomfort. EMS reports pt uses home O2 regularly, 1 Duo-Neb given en route to ED by EMS. Pt arrives A&Ox4, in NAD, with respirations even, regular, and unlabored.

## 2015-03-09 DIAGNOSIS — J449 Chronic obstructive pulmonary disease, unspecified: Secondary | ICD-10-CM | POA: Diagnosis not present

## 2015-03-10 DIAGNOSIS — Z0100 Encounter for examination of eyes and vision without abnormal findings: Secondary | ICD-10-CM | POA: Diagnosis not present

## 2015-03-10 DIAGNOSIS — J440 Chronic obstructive pulmonary disease with acute lower respiratory infection: Secondary | ICD-10-CM | POA: Diagnosis not present

## 2015-03-17 LAB — CULTURE, BLOOD (ROUTINE X 2)
CULTURE: NO GROWTH
Culture: NO GROWTH

## 2015-03-20 DIAGNOSIS — H2511 Age-related nuclear cataract, right eye: Secondary | ICD-10-CM | POA: Diagnosis not present

## 2015-04-04 DIAGNOSIS — J449 Chronic obstructive pulmonary disease, unspecified: Secondary | ICD-10-CM | POA: Diagnosis not present

## 2015-04-06 DIAGNOSIS — J449 Chronic obstructive pulmonary disease, unspecified: Secondary | ICD-10-CM | POA: Diagnosis not present

## 2015-05-05 DIAGNOSIS — J449 Chronic obstructive pulmonary disease, unspecified: Secondary | ICD-10-CM | POA: Diagnosis not present

## 2015-05-07 DIAGNOSIS — J449 Chronic obstructive pulmonary disease, unspecified: Secondary | ICD-10-CM | POA: Diagnosis not present

## 2015-05-22 DIAGNOSIS — E559 Vitamin D deficiency, unspecified: Secondary | ICD-10-CM | POA: Diagnosis not present

## 2015-05-22 DIAGNOSIS — I10 Essential (primary) hypertension: Secondary | ICD-10-CM | POA: Diagnosis not present

## 2015-05-29 DIAGNOSIS — J449 Chronic obstructive pulmonary disease, unspecified: Secondary | ICD-10-CM | POA: Diagnosis not present

## 2015-05-29 DIAGNOSIS — F0280 Dementia in other diseases classified elsewhere without behavioral disturbance: Secondary | ICD-10-CM | POA: Diagnosis not present

## 2015-05-29 DIAGNOSIS — E559 Vitamin D deficiency, unspecified: Secondary | ICD-10-CM | POA: Diagnosis not present

## 2015-05-29 DIAGNOSIS — G301 Alzheimer's disease with late onset: Secondary | ICD-10-CM | POA: Diagnosis not present

## 2015-05-29 DIAGNOSIS — E079 Disorder of thyroid, unspecified: Secondary | ICD-10-CM | POA: Diagnosis not present

## 2015-05-29 DIAGNOSIS — Z0001 Encounter for general adult medical examination with abnormal findings: Secondary | ICD-10-CM | POA: Diagnosis not present

## 2015-05-29 DIAGNOSIS — Z720 Tobacco use: Secondary | ICD-10-CM | POA: Diagnosis not present

## 2015-05-29 DIAGNOSIS — I10 Essential (primary) hypertension: Secondary | ICD-10-CM | POA: Diagnosis not present

## 2015-06-01 DIAGNOSIS — I110 Hypertensive heart disease with heart failure: Secondary | ICD-10-CM | POA: Diagnosis not present

## 2015-06-01 DIAGNOSIS — M1991 Primary osteoarthritis, unspecified site: Secondary | ICD-10-CM | POA: Diagnosis not present

## 2015-06-01 DIAGNOSIS — J449 Chronic obstructive pulmonary disease, unspecified: Secondary | ICD-10-CM | POA: Diagnosis not present

## 2015-06-01 DIAGNOSIS — F0280 Dementia in other diseases classified elsewhere without behavioral disturbance: Secondary | ICD-10-CM | POA: Diagnosis not present

## 2015-06-01 DIAGNOSIS — G301 Alzheimer's disease with late onset: Secondary | ICD-10-CM | POA: Diagnosis not present

## 2015-06-01 DIAGNOSIS — M4802 Spinal stenosis, cervical region: Secondary | ICD-10-CM | POA: Diagnosis not present

## 2015-06-01 DIAGNOSIS — M4806 Spinal stenosis, lumbar region: Secondary | ICD-10-CM | POA: Diagnosis not present

## 2015-06-01 DIAGNOSIS — D649 Anemia, unspecified: Secondary | ICD-10-CM | POA: Diagnosis not present

## 2015-06-01 DIAGNOSIS — I5020 Unspecified systolic (congestive) heart failure: Secondary | ICD-10-CM | POA: Diagnosis not present

## 2015-06-02 DIAGNOSIS — I5020 Unspecified systolic (congestive) heart failure: Secondary | ICD-10-CM | POA: Diagnosis not present

## 2015-06-02 DIAGNOSIS — D649 Anemia, unspecified: Secondary | ICD-10-CM | POA: Diagnosis not present

## 2015-06-02 DIAGNOSIS — G301 Alzheimer's disease with late onset: Secondary | ICD-10-CM | POA: Diagnosis not present

## 2015-06-02 DIAGNOSIS — J449 Chronic obstructive pulmonary disease, unspecified: Secondary | ICD-10-CM | POA: Diagnosis not present

## 2015-06-02 DIAGNOSIS — F0280 Dementia in other diseases classified elsewhere without behavioral disturbance: Secondary | ICD-10-CM | POA: Diagnosis not present

## 2015-06-02 DIAGNOSIS — I110 Hypertensive heart disease with heart failure: Secondary | ICD-10-CM | POA: Diagnosis not present

## 2015-06-02 DIAGNOSIS — M1991 Primary osteoarthritis, unspecified site: Secondary | ICD-10-CM | POA: Diagnosis not present

## 2015-06-02 DIAGNOSIS — M4806 Spinal stenosis, lumbar region: Secondary | ICD-10-CM | POA: Diagnosis not present

## 2015-06-02 DIAGNOSIS — M4802 Spinal stenosis, cervical region: Secondary | ICD-10-CM | POA: Diagnosis not present

## 2015-06-04 DIAGNOSIS — J449 Chronic obstructive pulmonary disease, unspecified: Secondary | ICD-10-CM | POA: Diagnosis not present

## 2015-06-06 DIAGNOSIS — G301 Alzheimer's disease with late onset: Secondary | ICD-10-CM | POA: Diagnosis not present

## 2015-06-06 DIAGNOSIS — I5020 Unspecified systolic (congestive) heart failure: Secondary | ICD-10-CM | POA: Diagnosis not present

## 2015-06-06 DIAGNOSIS — M4806 Spinal stenosis, lumbar region: Secondary | ICD-10-CM | POA: Diagnosis not present

## 2015-06-06 DIAGNOSIS — M1991 Primary osteoarthritis, unspecified site: Secondary | ICD-10-CM | POA: Diagnosis not present

## 2015-06-06 DIAGNOSIS — I110 Hypertensive heart disease with heart failure: Secondary | ICD-10-CM | POA: Diagnosis not present

## 2015-06-06 DIAGNOSIS — D649 Anemia, unspecified: Secondary | ICD-10-CM | POA: Diagnosis not present

## 2015-06-06 DIAGNOSIS — J449 Chronic obstructive pulmonary disease, unspecified: Secondary | ICD-10-CM | POA: Diagnosis not present

## 2015-06-06 DIAGNOSIS — M4802 Spinal stenosis, cervical region: Secondary | ICD-10-CM | POA: Diagnosis not present

## 2015-06-06 DIAGNOSIS — F0280 Dementia in other diseases classified elsewhere without behavioral disturbance: Secondary | ICD-10-CM | POA: Diagnosis not present

## 2015-06-10 DIAGNOSIS — M4806 Spinal stenosis, lumbar region: Secondary | ICD-10-CM | POA: Diagnosis not present

## 2015-06-10 DIAGNOSIS — I110 Hypertensive heart disease with heart failure: Secondary | ICD-10-CM | POA: Diagnosis not present

## 2015-06-10 DIAGNOSIS — G301 Alzheimer's disease with late onset: Secondary | ICD-10-CM | POA: Diagnosis not present

## 2015-06-10 DIAGNOSIS — F0280 Dementia in other diseases classified elsewhere without behavioral disturbance: Secondary | ICD-10-CM | POA: Diagnosis not present

## 2015-06-10 DIAGNOSIS — M4802 Spinal stenosis, cervical region: Secondary | ICD-10-CM | POA: Diagnosis not present

## 2015-06-10 DIAGNOSIS — M1991 Primary osteoarthritis, unspecified site: Secondary | ICD-10-CM | POA: Diagnosis not present

## 2015-06-10 DIAGNOSIS — J449 Chronic obstructive pulmonary disease, unspecified: Secondary | ICD-10-CM | POA: Diagnosis not present

## 2015-06-10 DIAGNOSIS — I5020 Unspecified systolic (congestive) heart failure: Secondary | ICD-10-CM | POA: Diagnosis not present

## 2015-06-10 DIAGNOSIS — D649 Anemia, unspecified: Secondary | ICD-10-CM | POA: Diagnosis not present

## 2015-06-12 DIAGNOSIS — I5020 Unspecified systolic (congestive) heart failure: Secondary | ICD-10-CM | POA: Diagnosis not present

## 2015-06-12 DIAGNOSIS — M1991 Primary osteoarthritis, unspecified site: Secondary | ICD-10-CM | POA: Diagnosis not present

## 2015-06-12 DIAGNOSIS — M4806 Spinal stenosis, lumbar region: Secondary | ICD-10-CM | POA: Diagnosis not present

## 2015-06-12 DIAGNOSIS — I110 Hypertensive heart disease with heart failure: Secondary | ICD-10-CM | POA: Diagnosis not present

## 2015-06-12 DIAGNOSIS — G301 Alzheimer's disease with late onset: Secondary | ICD-10-CM | POA: Diagnosis not present

## 2015-06-12 DIAGNOSIS — D649 Anemia, unspecified: Secondary | ICD-10-CM | POA: Diagnosis not present

## 2015-06-12 DIAGNOSIS — J449 Chronic obstructive pulmonary disease, unspecified: Secondary | ICD-10-CM | POA: Diagnosis not present

## 2015-06-12 DIAGNOSIS — M4802 Spinal stenosis, cervical region: Secondary | ICD-10-CM | POA: Diagnosis not present

## 2015-06-12 DIAGNOSIS — F0280 Dementia in other diseases classified elsewhere without behavioral disturbance: Secondary | ICD-10-CM | POA: Diagnosis not present

## 2015-06-18 DIAGNOSIS — M1991 Primary osteoarthritis, unspecified site: Secondary | ICD-10-CM | POA: Diagnosis not present

## 2015-06-18 DIAGNOSIS — J449 Chronic obstructive pulmonary disease, unspecified: Secondary | ICD-10-CM | POA: Diagnosis not present

## 2015-06-18 DIAGNOSIS — I110 Hypertensive heart disease with heart failure: Secondary | ICD-10-CM | POA: Diagnosis not present

## 2015-06-18 DIAGNOSIS — G301 Alzheimer's disease with late onset: Secondary | ICD-10-CM | POA: Diagnosis not present

## 2015-06-18 DIAGNOSIS — F0280 Dementia in other diseases classified elsewhere without behavioral disturbance: Secondary | ICD-10-CM | POA: Diagnosis not present

## 2015-06-18 DIAGNOSIS — I5020 Unspecified systolic (congestive) heart failure: Secondary | ICD-10-CM | POA: Diagnosis not present

## 2015-06-18 DIAGNOSIS — M4806 Spinal stenosis, lumbar region: Secondary | ICD-10-CM | POA: Diagnosis not present

## 2015-06-18 DIAGNOSIS — M4802 Spinal stenosis, cervical region: Secondary | ICD-10-CM | POA: Diagnosis not present

## 2015-06-18 DIAGNOSIS — D649 Anemia, unspecified: Secondary | ICD-10-CM | POA: Diagnosis not present

## 2015-06-19 DIAGNOSIS — J449 Chronic obstructive pulmonary disease, unspecified: Secondary | ICD-10-CM | POA: Diagnosis not present

## 2015-06-19 DIAGNOSIS — F0280 Dementia in other diseases classified elsewhere without behavioral disturbance: Secondary | ICD-10-CM | POA: Diagnosis not present

## 2015-06-19 DIAGNOSIS — M4806 Spinal stenosis, lumbar region: Secondary | ICD-10-CM | POA: Diagnosis not present

## 2015-06-19 DIAGNOSIS — G301 Alzheimer's disease with late onset: Secondary | ICD-10-CM | POA: Diagnosis not present

## 2015-06-19 DIAGNOSIS — I110 Hypertensive heart disease with heart failure: Secondary | ICD-10-CM | POA: Diagnosis not present

## 2015-06-19 DIAGNOSIS — I5020 Unspecified systolic (congestive) heart failure: Secondary | ICD-10-CM | POA: Diagnosis not present

## 2015-06-19 DIAGNOSIS — D649 Anemia, unspecified: Secondary | ICD-10-CM | POA: Diagnosis not present

## 2015-06-19 DIAGNOSIS — M4802 Spinal stenosis, cervical region: Secondary | ICD-10-CM | POA: Diagnosis not present

## 2015-06-19 DIAGNOSIS — M1991 Primary osteoarthritis, unspecified site: Secondary | ICD-10-CM | POA: Diagnosis not present

## 2015-06-24 DIAGNOSIS — D649 Anemia, unspecified: Secondary | ICD-10-CM | POA: Diagnosis not present

## 2015-06-24 DIAGNOSIS — F0280 Dementia in other diseases classified elsewhere without behavioral disturbance: Secondary | ICD-10-CM | POA: Diagnosis not present

## 2015-06-24 DIAGNOSIS — I110 Hypertensive heart disease with heart failure: Secondary | ICD-10-CM | POA: Diagnosis not present

## 2015-06-24 DIAGNOSIS — G301 Alzheimer's disease with late onset: Secondary | ICD-10-CM | POA: Diagnosis not present

## 2015-06-24 DIAGNOSIS — M4806 Spinal stenosis, lumbar region: Secondary | ICD-10-CM | POA: Diagnosis not present

## 2015-06-24 DIAGNOSIS — J449 Chronic obstructive pulmonary disease, unspecified: Secondary | ICD-10-CM | POA: Diagnosis not present

## 2015-06-24 DIAGNOSIS — M1991 Primary osteoarthritis, unspecified site: Secondary | ICD-10-CM | POA: Diagnosis not present

## 2015-06-24 DIAGNOSIS — I5020 Unspecified systolic (congestive) heart failure: Secondary | ICD-10-CM | POA: Diagnosis not present

## 2015-06-24 DIAGNOSIS — M4802 Spinal stenosis, cervical region: Secondary | ICD-10-CM | POA: Diagnosis not present

## 2015-06-25 DIAGNOSIS — D649 Anemia, unspecified: Secondary | ICD-10-CM | POA: Diagnosis not present

## 2015-06-25 DIAGNOSIS — M4806 Spinal stenosis, lumbar region: Secondary | ICD-10-CM | POA: Diagnosis not present

## 2015-06-25 DIAGNOSIS — J449 Chronic obstructive pulmonary disease, unspecified: Secondary | ICD-10-CM | POA: Diagnosis not present

## 2015-06-25 DIAGNOSIS — I110 Hypertensive heart disease with heart failure: Secondary | ICD-10-CM | POA: Diagnosis not present

## 2015-06-25 DIAGNOSIS — I5020 Unspecified systolic (congestive) heart failure: Secondary | ICD-10-CM | POA: Diagnosis not present

## 2015-06-25 DIAGNOSIS — M4802 Spinal stenosis, cervical region: Secondary | ICD-10-CM | POA: Diagnosis not present

## 2015-06-25 DIAGNOSIS — G301 Alzheimer's disease with late onset: Secondary | ICD-10-CM | POA: Diagnosis not present

## 2015-06-25 DIAGNOSIS — F0280 Dementia in other diseases classified elsewhere without behavioral disturbance: Secondary | ICD-10-CM | POA: Diagnosis not present

## 2015-06-25 DIAGNOSIS — M1991 Primary osteoarthritis, unspecified site: Secondary | ICD-10-CM | POA: Diagnosis not present

## 2015-06-26 DIAGNOSIS — D649 Anemia, unspecified: Secondary | ICD-10-CM | POA: Diagnosis not present

## 2015-06-26 DIAGNOSIS — M1991 Primary osteoarthritis, unspecified site: Secondary | ICD-10-CM | POA: Diagnosis not present

## 2015-06-26 DIAGNOSIS — J449 Chronic obstructive pulmonary disease, unspecified: Secondary | ICD-10-CM | POA: Diagnosis not present

## 2015-06-26 DIAGNOSIS — G301 Alzheimer's disease with late onset: Secondary | ICD-10-CM | POA: Diagnosis not present

## 2015-06-26 DIAGNOSIS — M4806 Spinal stenosis, lumbar region: Secondary | ICD-10-CM | POA: Diagnosis not present

## 2015-06-26 DIAGNOSIS — I110 Hypertensive heart disease with heart failure: Secondary | ICD-10-CM | POA: Diagnosis not present

## 2015-06-26 DIAGNOSIS — F0280 Dementia in other diseases classified elsewhere without behavioral disturbance: Secondary | ICD-10-CM | POA: Diagnosis not present

## 2015-06-26 DIAGNOSIS — I5020 Unspecified systolic (congestive) heart failure: Secondary | ICD-10-CM | POA: Diagnosis not present

## 2015-06-26 DIAGNOSIS — M4802 Spinal stenosis, cervical region: Secondary | ICD-10-CM | POA: Diagnosis not present

## 2015-07-01 DIAGNOSIS — I110 Hypertensive heart disease with heart failure: Secondary | ICD-10-CM | POA: Diagnosis not present

## 2015-07-01 DIAGNOSIS — G301 Alzheimer's disease with late onset: Secondary | ICD-10-CM | POA: Diagnosis not present

## 2015-07-01 DIAGNOSIS — M4802 Spinal stenosis, cervical region: Secondary | ICD-10-CM | POA: Diagnosis not present

## 2015-07-01 DIAGNOSIS — D649 Anemia, unspecified: Secondary | ICD-10-CM | POA: Diagnosis not present

## 2015-07-01 DIAGNOSIS — J449 Chronic obstructive pulmonary disease, unspecified: Secondary | ICD-10-CM | POA: Diagnosis not present

## 2015-07-01 DIAGNOSIS — M1991 Primary osteoarthritis, unspecified site: Secondary | ICD-10-CM | POA: Diagnosis not present

## 2015-07-01 DIAGNOSIS — I5020 Unspecified systolic (congestive) heart failure: Secondary | ICD-10-CM | POA: Diagnosis not present

## 2015-07-01 DIAGNOSIS — M4806 Spinal stenosis, lumbar region: Secondary | ICD-10-CM | POA: Diagnosis not present

## 2015-07-01 DIAGNOSIS — F0280 Dementia in other diseases classified elsewhere without behavioral disturbance: Secondary | ICD-10-CM | POA: Diagnosis not present

## 2015-07-04 DIAGNOSIS — M4806 Spinal stenosis, lumbar region: Secondary | ICD-10-CM | POA: Diagnosis not present

## 2015-07-04 DIAGNOSIS — J449 Chronic obstructive pulmonary disease, unspecified: Secondary | ICD-10-CM | POA: Diagnosis not present

## 2015-07-04 DIAGNOSIS — D649 Anemia, unspecified: Secondary | ICD-10-CM | POA: Diagnosis not present

## 2015-07-04 DIAGNOSIS — I110 Hypertensive heart disease with heart failure: Secondary | ICD-10-CM | POA: Diagnosis not present

## 2015-07-04 DIAGNOSIS — I5020 Unspecified systolic (congestive) heart failure: Secondary | ICD-10-CM | POA: Diagnosis not present

## 2015-07-04 DIAGNOSIS — F0280 Dementia in other diseases classified elsewhere without behavioral disturbance: Secondary | ICD-10-CM | POA: Diagnosis not present

## 2015-07-04 DIAGNOSIS — M4802 Spinal stenosis, cervical region: Secondary | ICD-10-CM | POA: Diagnosis not present

## 2015-07-04 DIAGNOSIS — M1991 Primary osteoarthritis, unspecified site: Secondary | ICD-10-CM | POA: Diagnosis not present

## 2015-07-04 DIAGNOSIS — G301 Alzheimer's disease with late onset: Secondary | ICD-10-CM | POA: Diagnosis not present

## 2015-07-05 DIAGNOSIS — J449 Chronic obstructive pulmonary disease, unspecified: Secondary | ICD-10-CM | POA: Diagnosis not present

## 2015-07-07 DIAGNOSIS — J449 Chronic obstructive pulmonary disease, unspecified: Secondary | ICD-10-CM | POA: Diagnosis not present

## 2015-07-09 DIAGNOSIS — G301 Alzheimer's disease with late onset: Secondary | ICD-10-CM | POA: Diagnosis not present

## 2015-07-09 DIAGNOSIS — M1991 Primary osteoarthritis, unspecified site: Secondary | ICD-10-CM | POA: Diagnosis not present

## 2015-07-09 DIAGNOSIS — M4802 Spinal stenosis, cervical region: Secondary | ICD-10-CM | POA: Diagnosis not present

## 2015-07-09 DIAGNOSIS — M4806 Spinal stenosis, lumbar region: Secondary | ICD-10-CM | POA: Diagnosis not present

## 2015-07-09 DIAGNOSIS — I5020 Unspecified systolic (congestive) heart failure: Secondary | ICD-10-CM | POA: Diagnosis not present

## 2015-07-09 DIAGNOSIS — F0280 Dementia in other diseases classified elsewhere without behavioral disturbance: Secondary | ICD-10-CM | POA: Diagnosis not present

## 2015-07-09 DIAGNOSIS — D649 Anemia, unspecified: Secondary | ICD-10-CM | POA: Diagnosis not present

## 2015-07-09 DIAGNOSIS — I110 Hypertensive heart disease with heart failure: Secondary | ICD-10-CM | POA: Diagnosis not present

## 2015-07-09 DIAGNOSIS — J449 Chronic obstructive pulmonary disease, unspecified: Secondary | ICD-10-CM | POA: Diagnosis not present

## 2015-07-28 DIAGNOSIS — D649 Anemia, unspecified: Secondary | ICD-10-CM | POA: Diagnosis not present

## 2015-07-28 DIAGNOSIS — I5020 Unspecified systolic (congestive) heart failure: Secondary | ICD-10-CM | POA: Diagnosis not present

## 2015-07-28 DIAGNOSIS — M4802 Spinal stenosis, cervical region: Secondary | ICD-10-CM | POA: Diagnosis not present

## 2015-07-28 DIAGNOSIS — M4806 Spinal stenosis, lumbar region: Secondary | ICD-10-CM | POA: Diagnosis not present

## 2015-07-28 DIAGNOSIS — F0280 Dementia in other diseases classified elsewhere without behavioral disturbance: Secondary | ICD-10-CM | POA: Diagnosis not present

## 2015-07-28 DIAGNOSIS — M1991 Primary osteoarthritis, unspecified site: Secondary | ICD-10-CM | POA: Diagnosis not present

## 2015-07-28 DIAGNOSIS — J449 Chronic obstructive pulmonary disease, unspecified: Secondary | ICD-10-CM | POA: Diagnosis not present

## 2015-07-28 DIAGNOSIS — G301 Alzheimer's disease with late onset: Secondary | ICD-10-CM | POA: Diagnosis not present

## 2015-07-28 DIAGNOSIS — I110 Hypertensive heart disease with heart failure: Secondary | ICD-10-CM | POA: Diagnosis not present

## 2015-08-04 DIAGNOSIS — J449 Chronic obstructive pulmonary disease, unspecified: Secondary | ICD-10-CM | POA: Diagnosis not present

## 2015-08-05 DIAGNOSIS — I5020 Unspecified systolic (congestive) heart failure: Secondary | ICD-10-CM | POA: Diagnosis not present

## 2015-08-05 DIAGNOSIS — J449 Chronic obstructive pulmonary disease, unspecified: Secondary | ICD-10-CM | POA: Diagnosis not present

## 2015-08-05 DIAGNOSIS — I110 Hypertensive heart disease with heart failure: Secondary | ICD-10-CM | POA: Diagnosis not present

## 2015-08-05 DIAGNOSIS — M4802 Spinal stenosis, cervical region: Secondary | ICD-10-CM | POA: Diagnosis not present

## 2015-08-05 DIAGNOSIS — M4806 Spinal stenosis, lumbar region: Secondary | ICD-10-CM | POA: Diagnosis not present

## 2015-08-05 DIAGNOSIS — M1991 Primary osteoarthritis, unspecified site: Secondary | ICD-10-CM | POA: Diagnosis not present

## 2015-08-05 DIAGNOSIS — G301 Alzheimer's disease with late onset: Secondary | ICD-10-CM | POA: Diagnosis not present

## 2015-08-05 DIAGNOSIS — D649 Anemia, unspecified: Secondary | ICD-10-CM | POA: Diagnosis not present

## 2015-08-05 DIAGNOSIS — F0280 Dementia in other diseases classified elsewhere without behavioral disturbance: Secondary | ICD-10-CM | POA: Diagnosis not present

## 2015-08-06 DIAGNOSIS — J449 Chronic obstructive pulmonary disease, unspecified: Secondary | ICD-10-CM | POA: Diagnosis not present

## 2015-08-21 DIAGNOSIS — I10 Essential (primary) hypertension: Secondary | ICD-10-CM | POA: Diagnosis not present

## 2015-08-22 DIAGNOSIS — M1991 Primary osteoarthritis, unspecified site: Secondary | ICD-10-CM | POA: Diagnosis not present

## 2015-08-22 DIAGNOSIS — J449 Chronic obstructive pulmonary disease, unspecified: Secondary | ICD-10-CM | POA: Diagnosis not present

## 2015-08-22 DIAGNOSIS — F0280 Dementia in other diseases classified elsewhere without behavioral disturbance: Secondary | ICD-10-CM | POA: Diagnosis not present

## 2015-08-22 DIAGNOSIS — M4802 Spinal stenosis, cervical region: Secondary | ICD-10-CM | POA: Diagnosis not present

## 2015-08-22 DIAGNOSIS — D649 Anemia, unspecified: Secondary | ICD-10-CM | POA: Diagnosis not present

## 2015-08-22 DIAGNOSIS — I110 Hypertensive heart disease with heart failure: Secondary | ICD-10-CM | POA: Diagnosis not present

## 2015-08-22 DIAGNOSIS — I5020 Unspecified systolic (congestive) heart failure: Secondary | ICD-10-CM | POA: Diagnosis not present

## 2015-08-22 DIAGNOSIS — G301 Alzheimer's disease with late onset: Secondary | ICD-10-CM | POA: Diagnosis not present

## 2015-08-22 DIAGNOSIS — M4806 Spinal stenosis, lumbar region: Secondary | ICD-10-CM | POA: Diagnosis not present

## 2015-08-28 DIAGNOSIS — I10 Essential (primary) hypertension: Secondary | ICD-10-CM | POA: Diagnosis not present

## 2015-08-28 DIAGNOSIS — J449 Chronic obstructive pulmonary disease, unspecified: Secondary | ICD-10-CM | POA: Diagnosis not present

## 2015-09-04 DIAGNOSIS — J449 Chronic obstructive pulmonary disease, unspecified: Secondary | ICD-10-CM | POA: Diagnosis not present

## 2015-09-06 DIAGNOSIS — J449 Chronic obstructive pulmonary disease, unspecified: Secondary | ICD-10-CM | POA: Diagnosis not present

## 2015-10-05 DIAGNOSIS — J449 Chronic obstructive pulmonary disease, unspecified: Secondary | ICD-10-CM | POA: Diagnosis not present

## 2015-10-07 DIAGNOSIS — J449 Chronic obstructive pulmonary disease, unspecified: Secondary | ICD-10-CM | POA: Diagnosis not present

## 2015-11-04 DIAGNOSIS — J449 Chronic obstructive pulmonary disease, unspecified: Secondary | ICD-10-CM | POA: Diagnosis not present

## 2015-11-06 DIAGNOSIS — J449 Chronic obstructive pulmonary disease, unspecified: Secondary | ICD-10-CM | POA: Diagnosis not present

## 2015-12-01 DIAGNOSIS — I10 Essential (primary) hypertension: Secondary | ICD-10-CM | POA: Diagnosis not present

## 2015-12-01 DIAGNOSIS — J449 Chronic obstructive pulmonary disease, unspecified: Secondary | ICD-10-CM | POA: Diagnosis not present

## 2015-12-01 DIAGNOSIS — Z0000 Encounter for general adult medical examination without abnormal findings: Secondary | ICD-10-CM | POA: Diagnosis not present

## 2015-12-05 DIAGNOSIS — J449 Chronic obstructive pulmonary disease, unspecified: Secondary | ICD-10-CM | POA: Diagnosis not present

## 2015-12-07 DIAGNOSIS — J449 Chronic obstructive pulmonary disease, unspecified: Secondary | ICD-10-CM | POA: Diagnosis not present

## 2016-01-04 DIAGNOSIS — J449 Chronic obstructive pulmonary disease, unspecified: Secondary | ICD-10-CM | POA: Diagnosis not present

## 2016-01-06 DIAGNOSIS — J449 Chronic obstructive pulmonary disease, unspecified: Secondary | ICD-10-CM | POA: Diagnosis not present

## 2016-02-04 DIAGNOSIS — J449 Chronic obstructive pulmonary disease, unspecified: Secondary | ICD-10-CM | POA: Diagnosis not present

## 2016-02-06 DIAGNOSIS — J449 Chronic obstructive pulmonary disease, unspecified: Secondary | ICD-10-CM | POA: Diagnosis not present

## 2016-03-06 DIAGNOSIS — J449 Chronic obstructive pulmonary disease, unspecified: Secondary | ICD-10-CM | POA: Diagnosis not present

## 2016-03-08 DIAGNOSIS — J449 Chronic obstructive pulmonary disease, unspecified: Secondary | ICD-10-CM | POA: Diagnosis not present

## 2016-03-09 DIAGNOSIS — J449 Chronic obstructive pulmonary disease, unspecified: Secondary | ICD-10-CM | POA: Diagnosis not present

## 2016-03-09 DIAGNOSIS — G301 Alzheimer's disease with late onset: Secondary | ICD-10-CM | POA: Diagnosis not present

## 2016-03-09 DIAGNOSIS — F0280 Dementia in other diseases classified elsewhere without behavioral disturbance: Secondary | ICD-10-CM | POA: Diagnosis not present

## 2016-03-09 DIAGNOSIS — I10 Essential (primary) hypertension: Secondary | ICD-10-CM | POA: Diagnosis not present

## 2016-03-24 DIAGNOSIS — M3501 Sicca syndrome with keratoconjunctivitis: Secondary | ICD-10-CM | POA: Diagnosis not present

## 2016-04-03 DIAGNOSIS — J449 Chronic obstructive pulmonary disease, unspecified: Secondary | ICD-10-CM | POA: Diagnosis not present

## 2016-04-05 DIAGNOSIS — J449 Chronic obstructive pulmonary disease, unspecified: Secondary | ICD-10-CM | POA: Diagnosis not present

## 2016-05-04 DIAGNOSIS — J449 Chronic obstructive pulmonary disease, unspecified: Secondary | ICD-10-CM | POA: Diagnosis not present

## 2016-05-06 DIAGNOSIS — J449 Chronic obstructive pulmonary disease, unspecified: Secondary | ICD-10-CM | POA: Diagnosis not present

## 2016-06-03 DIAGNOSIS — J449 Chronic obstructive pulmonary disease, unspecified: Secondary | ICD-10-CM | POA: Diagnosis not present

## 2016-06-05 DIAGNOSIS — J449 Chronic obstructive pulmonary disease, unspecified: Secondary | ICD-10-CM | POA: Diagnosis not present

## 2016-06-10 DIAGNOSIS — J449 Chronic obstructive pulmonary disease, unspecified: Secondary | ICD-10-CM | POA: Diagnosis not present

## 2016-06-10 DIAGNOSIS — G301 Alzheimer's disease with late onset: Secondary | ICD-10-CM | POA: Diagnosis not present

## 2016-06-10 DIAGNOSIS — R634 Abnormal weight loss: Secondary | ICD-10-CM | POA: Diagnosis not present

## 2016-06-10 DIAGNOSIS — I10 Essential (primary) hypertension: Secondary | ICD-10-CM | POA: Diagnosis not present

## 2016-06-10 DIAGNOSIS — F0280 Dementia in other diseases classified elsewhere without behavioral disturbance: Secondary | ICD-10-CM | POA: Diagnosis not present

## 2016-06-10 DIAGNOSIS — Z0001 Encounter for general adult medical examination with abnormal findings: Secondary | ICD-10-CM | POA: Diagnosis not present

## 2016-06-16 DIAGNOSIS — D531 Other megaloblastic anemias, not elsewhere classified: Secondary | ICD-10-CM | POA: Diagnosis not present

## 2016-09-11 DEATH — deceased
